# Patient Record
Sex: Female | Born: 1995
Health system: Southern US, Community
[De-identification: ages and names within clinical notes are randomized; demographics above are authoritative.]

## PROBLEM LIST (undated history)

## (undated) DIAGNOSIS — J45909 Unspecified asthma, uncomplicated: Secondary | ICD-10-CM

---

## 2014-01-01 ENCOUNTER — Encounter (HOSPITAL_COMMUNITY): Payer: Self-pay | Admitting: *Deleted

## 2014-01-01 ENCOUNTER — Emergency Department (HOSPITAL_COMMUNITY)
Admission: EM | Admit: 2014-01-01 | Discharge: 2014-01-01 | Disposition: A | Discharge status: Home / Self Care | Payer: BC Managed Care – PPO | Attending: Emergency Medicine | Admitting: Emergency Medicine

## 2014-01-01 DIAGNOSIS — J45909 Unspecified asthma, uncomplicated: Secondary | ICD-10-CM | POA: Diagnosis present

## 2014-01-01 DIAGNOSIS — R000 Tachycardia, unspecified: Secondary | ICD-10-CM | POA: Diagnosis not present

## 2014-01-01 DIAGNOSIS — J45901 Unspecified asthma with (acute) exacerbation: Secondary | ICD-10-CM

## 2014-01-01 HISTORY — DX: Unspecified asthma, uncomplicated: J45.909

## 2014-01-01 MED ORDER — PREDNISONE 20 MG PO TABS: ORAL_TABLET | ORAL | Status: DC

## 2014-01-01 MED ORDER — IPRATROPIUM-ALBUTEROL 0.5-2.5 (3) MG/3ML IN SOLN
3.0000 mL | Freq: Once | RESPIRATORY_TRACT | Status: AC
  Administered 2014-01-01: 3 mL via RESPIRATORY_TRACT
  Filled 2014-01-01: qty 3

## 2014-01-01 MED ORDER — PREDNISONE 20 MG PO TABS
60.0000 mg | ORAL_TABLET | Freq: Once | ORAL | Status: AC
  Administered 2014-01-01: 60 mg via ORAL
  Filled 2014-01-01: qty 3

## 2014-01-01 NOTE — ED Notes (Signed)
Md at bedside

## 2014-01-01 NOTE — Discharge Instructions (Signed)

## 2014-01-01 NOTE — ED Provider Notes (Signed)
CSN: [id number]     Arrival date & time 01/01/14  1526 History   First MD Initiated Contact with Patient 01/01/14 1554     Chief Complaint  Patient presents with  . Asthma     (Consider location/radiation/quality/duration/timing/severity/associated sxs/prior Treatment) HPI 18 year old female with history of asthma presents complaining of an asthma attack. She was in class nearby when she gradually began feeling more shortness of breath. She says it feels like a typical asthma attack, however worse than usual. She took several puffs of her albuterol inhaler with only minimal relief. She presented to the emergency department to [facility] and feeling short of breath. She received albuterol inhaler in triage.  At time of exam patient denies any residual shortness of breath, says she feels much better after breathing treatment. She denies any chest pain, no lightheadedness or syncope.   Past Medical History  Diagnosis Date  . Asthma    History reviewed. No pertinent past surgical history. History reviewed. No pertinent family history. History  Substance Use Topics  . Smoking status: Never Smoker   . Smokeless tobacco: Not on file  . Alcohol Use: No   OB History    No data available     Review of Systems  Respiratory: Positive for shortness of breath and wheezing. Negative for cough.   All other systems reviewed and are negative.     Allergies  Sulfa antibiotics  Home Medications   Prior to Admission medications   Not on File   BP 123/77 mmHg  Pulse 109  Temp(Src) 98.1 F (36.7 C) (Oral)  Resp 20  SpO2 100%  LMP 12/30/2013 Physical Exam  Constitutional: She is oriented to person, place, and time. She appears well-developed and well-nourished. No distress.  HENT:  Head: Normocephalic and atraumatic.  Eyes: Pupils are equal, round, and reactive to light.  Neck: Normal range of motion.  Cardiovascular: Tachycardia present.   Pulmonary/Chest: Effort normal and breath  sounds normal. No respiratory distress. She has no wheezes.  Musculoskeletal: She exhibits no edema or tenderness.  Neurological: She is alert and oriented to person, place, and time.  Skin: No rash noted.  Psychiatric: She has a normal mood and affect.  Nursing note and vitals reviewed.   ED Course  Procedures (including critical care time) Labs Review Labs Reviewed - No data to display  Imaging Review No results found.   EKG Interpretation None      MDM   Final diagnoses:  None    18 year old female presents acute asthma exacerbation. Says it feels similar to previous asthma exacerbations. Arrived wheezing and short of breath in triage and received a DuoNeb there. Upon arrival to room patient was tachycardic appeared anxious but had no wheezing and was in no respiratory distress. Prednisone given. Observed in the emergency department for an hour on pulse oximetry and had no return of symptoms or desaturations. She was walked up and down the halls with no return of symptoms or desaturation. Believe she is stable for discharge home, will give her a 5 day prednisone burst. She'll follow-up with her PCP.    Erskine Emeryhris Wiley Flicker, MD 01/01/14 1735  Nelia Shiobert L Beaton, MD 01/01/14 [phone]

## 2014-01-01 NOTE — ED Notes (Signed)
Pt and family reports hx of asthma, onset of sob pta. No relief with inhaler. spo2 100% at triage, pt appears anxious.

## 2016-03-07 ENCOUNTER — Observation Stay (HOSPITAL_BASED_OUTPATIENT_CLINIC_OR_DEPARTMENT_OTHER)
Admission: EM | Admit: 2016-03-07 | Discharge: 2016-03-08 | Disposition: A | Discharge status: Home / Self Care | Payer: 59 | Attending: Internal Medicine | Admitting: Internal Medicine

## 2016-03-07 ENCOUNTER — Encounter (HOSPITAL_BASED_OUTPATIENT_CLINIC_OR_DEPARTMENT_OTHER): Payer: Self-pay

## 2016-03-07 DIAGNOSIS — R531 Weakness: Secondary | ICD-10-CM | POA: Diagnosis not present

## 2016-03-07 DIAGNOSIS — R509 Fever, unspecified: Secondary | ICD-10-CM

## 2016-03-07 DIAGNOSIS — R1011 Right upper quadrant pain: Secondary | ICD-10-CM

## 2016-03-07 DIAGNOSIS — J45909 Unspecified asthma, uncomplicated: Secondary | ICD-10-CM | POA: Diagnosis not present

## 2016-03-07 DIAGNOSIS — B349 Viral infection, unspecified: Secondary | ICD-10-CM | POA: Diagnosis not present

## 2016-03-07 DIAGNOSIS — I959 Hypotension, unspecified: Secondary | ICD-10-CM | POA: Insufficient documentation

## 2016-03-07 DIAGNOSIS — K2970 Gastritis, unspecified, without bleeding: Secondary | ICD-10-CM | POA: Diagnosis not present

## 2016-03-07 DIAGNOSIS — R0600 Dyspnea, unspecified: Secondary | ICD-10-CM

## 2016-03-07 DIAGNOSIS — R112 Nausea with vomiting, unspecified: Secondary | ICD-10-CM

## 2016-03-07 DIAGNOSIS — J45901 Unspecified asthma with (acute) exacerbation: Secondary | ICD-10-CM | POA: Diagnosis present

## 2016-03-07 DIAGNOSIS — A419 Sepsis, unspecified organism: Secondary | ICD-10-CM | POA: Diagnosis not present

## 2016-03-07 DIAGNOSIS — E860 Dehydration: Secondary | ICD-10-CM

## 2016-03-07 DIAGNOSIS — R0602 Shortness of breath: Secondary | ICD-10-CM | POA: Diagnosis present

## 2016-03-07 DIAGNOSIS — R404 Transient alteration of awareness: Secondary | ICD-10-CM | POA: Diagnosis not present

## 2016-03-07 LAB — CBC
HCT: 39.4 % (ref 36.0–46.0)
Hemoglobin: 13.7 g/dL (ref 12.0–15.0)
MCH: 31.6 pg (ref 26.0–34.0)
MCHC: 34.8 g/dL (ref 30.0–36.0)
MCV: 91 fL (ref 78.0–100.0)
Platelets: 293 10*3/uL (ref 150–400)
RBC: 4.33 MIL/uL (ref 3.87–5.11)
RDW: 12 % (ref 11.5–15.5)
WBC: 11.9 10*3/uL — ABNORMAL HIGH (ref 4.0–10.5)

## 2016-03-07 LAB — COMPREHENSIVE METABOLIC PANEL
ALK PHOS: 39 U/L (ref 38–126)
ALT: 20 U/L (ref 14–54)
ANION GAP: 10 (ref 5–15)
AST: 26 U/L (ref 15–41)
Albumin: 4 g/dL (ref 3.5–5.0)
BILIRUBIN TOTAL: 1.2 mg/dL (ref 0.3–1.2)
BUN: 17 mg/dL (ref 6–20)
CO2: 19 mmol/L — AB (ref 22–32)
CREATININE: 0.75 mg/dL (ref 0.44–1.00)
Calcium: 8.3 mg/dL — ABNORMAL LOW (ref 8.9–10.3)
Chloride: 106 mmol/L (ref 101–111)
Glucose, Bld: 91 mg/dL (ref 65–99)
Potassium: 3.7 mmol/L (ref 3.5–5.1)
SODIUM: 135 mmol/L (ref 135–145)
TOTAL PROTEIN: 6.6 g/dL (ref 6.5–8.1)

## 2016-03-07 LAB — LIPASE, BLOOD: Lipase: 25 U/L (ref 11–51)

## 2016-03-07 MED ORDER — ACETAMINOPHEN 325 MG PO TABS
650.0000 mg | ORAL_TABLET | Freq: Once | ORAL | Status: AC
  Administered 2016-03-07: 650 mg via ORAL
  Filled 2016-03-07: qty 2

## 2016-03-07 MED ORDER — SODIUM CHLORIDE 0.9 % IV BOLUS (SEPSIS)
1000.0000 mL | Freq: Once | INTRAVENOUS | Status: AC
Start: 2016-03-07 — End: 2016-03-08
  Administered 2016-03-08: 1000 mL via INTRAVENOUS

## 2016-03-07 MED ORDER — KETOROLAC TROMETHAMINE 30 MG/ML IJ SOLN
30.0000 mg | Freq: Once | INTRAMUSCULAR | Status: AC
  Administered 2016-03-08: 30 mg via INTRAVENOUS
  Filled 2016-03-07: qty 1

## 2016-03-07 MED ORDER — DICYCLOMINE HCL 10 MG/ML IM SOLN
20.0000 mg | Freq: Once | INTRAMUSCULAR | Status: AC
  Administered 2016-03-08: 20 mg via INTRAMUSCULAR
  Filled 2016-03-07: qty 2

## 2016-03-07 NOTE — ED Provider Notes (Signed)
By signing my name below, I, Vista Mink, attest that this documentation has been prepared under the direction and in the presence of Kristen N Ward, DO. Electronically signed, Vista Mink, ED Scribe. 03/07/16. 11:35 PM.  TIME SEEN: 11:39 PM  CHIEF COMPLAINT: Emesis, shortness of breath.  HPI:  HPI Comments: Bridget Melendez is a 20 y.o. female with history of asthma brought in by ambulance, who presents to the Emergency Department complaining of persistent nausea with associated vomiting that started at approximately 1700 this evening. She has had difficulty tolerating PO's. She also notes that she had an asthma exacerbation this evening following the nausea and vomiting. She has had approximately 7 episodes of vomiting. She reports a fever at home but did not take any OTC medications at home. Her temperature on arrival here is 101.4. She used a rescue inhaler at home and received a nebulizer treatment on arrival of EMS. Was not given Solu-Medrol. Pt used her rescue inhaler at home and mother states that she did not want to continue to use it because she could feel her heart racing. Her nausea is currently improved after being given Zofran 4mg  in route by EMS. 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 No recent travel. Her flu shot is not UTD. No cough, nasal congestion. Has had some body aches. No diarrhea. No noted rash. Complains of feeling crampy abdominal pain diffusely. No history of abdominal surgery. No dysuria, hematuria, vaginal bleeding or discharge.  ROS: See HPI Constitutional: + fever  Eyes: no drainage  ENT: no runny nose   Cardiovascular:  no chest pain  Resp: + SOB  GI: + vomiting GU: no dysuria Integumentary: no rash  Allergy: no hives  Musculoskeletal: no leg swelling  Neurological: no slurred speech ROS otherwise negative  PAST MEDICAL HISTORY/PAST SURGICAL HISTORY:  Past Medical History:  Diagnosis  Date  . Asthma    MEDICATIONS:  Prior to Admission medications   Medication Sig Start Date End Date Taking? Authorizing Provider  ADVAIR HFA 230-21 MCG/ACT inhaler Inhale 2 puffs into the lungs daily. 12/27/13   Historical Provider, MD  montelukast (SINGULAIR) 10 MG tablet Take 10 mg by mouth daily. 12/04/13   Historical Provider, MD   ALLERGIES:  Allergies  Allergen Reactions  . Sulfa Antibiotics     SOCIAL HISTORY:  Social History  Substance Use Topics  . Smoking status: Never Smoker  . Smokeless tobacco: Never Used  . Alcohol use No   FAMILY HISTORY: No family history on file.  EXAM: BP 99/55   Pulse (!) 127   Temp 101.4 F (38.6 C)   Resp 20   Ht 5\' 2"  (1.575 m)   Wt 125 lb (56.7 kg)   LMP 02/29/2016   SpO2 98%   BMI 22.86 kg/m  CONSTITUTIONAL: Alert and oriented and responds appropriately to questions. Well-appearing; well-nourished HEAD: Normocephalic EYES: Conjunctivae clear, PERRL, EOMI ENT: normal nose; no rhinorrhea; dry mucous membranes NECK: Supple, no meningismus, no nuchal rigidity, no LAD  CARD: Regular and tachycardic; S1 and S2 appreciated; no murmurs, no clicks, no rubs, no gallops RESP: Normal chest excursion without splinting or tachypnea; breath sounds clear and equal bilaterally; no wheezes, no rhonchi, no rales, no hypoxia or respiratory distress, speaking full sentences ABD/GI: Normal bowel sounds; non-distended; soft, mildly tender to palpation diffusely, no rebound, no guarding, no peritoneal signs, no hepatosplenomegaly BACK:  The back appears normal and is non-tender to  palpation, there is no CVA tenderness EXT: Normal ROM in all joints; non-tender to palpation; no edema; normal capillary refill; no cyanosis, no calf tenderness or swelling    SKIN: Normal color for age and race; warm; no rash NEURO: Moves all extremities equally, sensation to light touch intact diffusely, cranial nerves II through XII intact, normal speech PSYCH: The  patient's mood and manner are appropriate. Grooming and personal hygiene are appropriate.  MEDICAL DECISION MAKING: Patient here with likely viral illness. Patient began having nausea and vomiting tonight with diffuse abdominal cramping. No diarrhea. States had a subsequent asthma exacerbation that has resolved after albuterol with EMS. Not given steroids. No recent cough. No shortness of breath currently and lungs are clear. No hypoxia. She is tachycardic which may be from discomfort, dehydration, albuterol, fever. Will give IV fluids, Toradol, Bentyl and reassess. Given Zofran with EMS and states nausea has improved. Abdominal exam is benign. She is mildly tender diffusely without focality.  ED PROGRESS: Patient's labs show bicarbonate of 19, large ketones in her urine. Mild leukocytosis. Suggestive of dehydration. She has received 2 L of IV fluids and is still tachycardic and mildly hypotensive. Fever has improved. On reevaluation of her abdomen she is now tender locally in the right upper quadrant with a positive Murphy sign. No tenderness at McBurney's point. No known history of gallbladder disease. States she has noticed right upper quadrant pain for the past several days with no aggravating or alleviating factors. LFTs and lipase are normal today but I feel she will need an ultrasound to evaluate her gallbladder. Unfortunately we cannot perform this test in our emergency department this time therefore [facility]. Discussed with Dr. Mora Bellmanni in the emergency department who agrees to accept the patient in transfer.  Patient and family have been updated with this plan. Patient refuses further pain medication at this time. We will keep her NPO.  I reviewed all nursing notes, vitals, pertinent old records, EKGs, labs, imaging (as available).  I personally performed the services described in this documentation, which was scribed in my presence. The recorded  information has been reviewed and is accurate.     Layla MawKristen N Ward, DO 03/08/16 [phone]

## 2016-03-07 NOTE — ED Triage Notes (Signed)
Per EMS pt c/o flu like symptoms, SBP 92, gave NS 1L and zofran 4mg  with SBP 108; pt in no distress

## 2016-03-08 ENCOUNTER — Encounter (HOSPITAL_COMMUNITY): Payer: Self-pay | Admitting: Emergency Medicine

## 2016-03-08 ENCOUNTER — Inpatient Hospital Stay (HOSPITAL_COMMUNITY): Payer: 59

## 2016-03-08 ENCOUNTER — Observation Stay (HOSPITAL_COMMUNITY): Payer: 59

## 2016-03-08 DIAGNOSIS — B349 Viral infection, unspecified: Secondary | ICD-10-CM

## 2016-03-08 DIAGNOSIS — K2900 Acute gastritis without bleeding: Secondary | ICD-10-CM | POA: Diagnosis not present

## 2016-03-08 DIAGNOSIS — J4521 Mild intermittent asthma with (acute) exacerbation: Secondary | ICD-10-CM | POA: Diagnosis not present

## 2016-03-08 DIAGNOSIS — A419 Sepsis, unspecified organism: Secondary | ICD-10-CM | POA: Diagnosis present

## 2016-03-08 DIAGNOSIS — R109 Unspecified abdominal pain: Secondary | ICD-10-CM | POA: Diagnosis not present

## 2016-03-08 DIAGNOSIS — R1011 Right upper quadrant pain: Secondary | ICD-10-CM | POA: Diagnosis not present

## 2016-03-08 DIAGNOSIS — R0602 Shortness of breath: Secondary | ICD-10-CM | POA: Diagnosis not present

## 2016-03-08 DIAGNOSIS — R1110 Vomiting, unspecified: Secondary | ICD-10-CM | POA: Diagnosis not present

## 2016-03-08 DIAGNOSIS — J45901 Unspecified asthma with (acute) exacerbation: Secondary | ICD-10-CM | POA: Diagnosis present

## 2016-03-08 LAB — RESPIRATORY PANEL BY PCR
ADENOVIRUS-RVPPCR: NOT DETECTED
Bordetella pertussis: NOT DETECTED
CHLAMYDOPHILA PNEUMONIAE-RVPPCR: NOT DETECTED
CORONAVIRUS NL63-RVPPCR: NOT DETECTED
CORONAVIRUS OC43-RVPPCR: NOT DETECTED
Coronavirus 229E: NOT DETECTED
Coronavirus HKU1: NOT DETECTED
INFLUENZA A-RVPPCR: NOT DETECTED
Influenza B: NOT DETECTED
Metapneumovirus: NOT DETECTED
Mycoplasma pneumoniae: NOT DETECTED
PARAINFLUENZA VIRUS 1-RVPPCR: NOT DETECTED
PARAINFLUENZA VIRUS 4-RVPPCR: NOT DETECTED
Parainfluenza Virus 2: NOT DETECTED
Parainfluenza Virus 3: NOT DETECTED
Respiratory Syncytial Virus: NOT DETECTED
Rhinovirus / Enterovirus: NOT DETECTED

## 2016-03-08 LAB — LACTIC ACID, PLASMA
Lactic Acid, Venous: 0.9 mmol/L (ref 0.5–1.9)
Lactic Acid, Venous: 0.8 mmol/L (ref 0.5–1.9)

## 2016-03-08 LAB — URINALYSIS, ROUTINE W REFLEX MICROSCOPIC
Bilirubin Urine: NEGATIVE
GLUCOSE, UA: NEGATIVE mg/dL
HGB URINE DIPSTICK: NEGATIVE
Ketones, ur: >80 mg/dL — AB
Leukocytes, UA: NEGATIVE
Nitrite: NEGATIVE
PROTEIN: NEGATIVE mg/dL
Specific Gravity, Urine: 1.023 (ref 1.005–1.030)
pH: 6 (ref 5.0–8.0)

## 2016-03-08 LAB — PREGNANCY, URINE: PREG TEST UR: NEGATIVE

## 2016-03-08 LAB — I-STAT CG4 LACTIC ACID, ED: LACTIC ACID, VENOUS: 0.87 mmol/L (ref 0.5–1.9)

## 2016-03-08 LAB — INFLUENZA PANEL BY PCR (TYPE A & B)
INFLAPCR: NEGATIVE
Influenza B By PCR: NEGATIVE

## 2016-03-08 MED ORDER — FENTANYL CITRATE (PF) 100 MCG/2ML IJ SOLN
50.0000 ug | Freq: Once | INTRAMUSCULAR | Status: AC
  Administered 2016-03-08: 50 ug via INTRAVENOUS
  Filled 2016-03-08: qty 2

## 2016-03-08 MED ORDER — SODIUM CHLORIDE 0.9 % IV SOLN
INTRAVENOUS | Status: DC
  Administered 2016-03-08: 01:00:00 via INTRAVENOUS

## 2016-03-08 MED ORDER — ACETAMINOPHEN 325 MG PO TABS
650.0000 mg | ORAL_TABLET | Freq: Once | ORAL | Status: AC
  Administered 2016-03-08: 650 mg via ORAL
  Filled 2016-03-08: qty 2

## 2016-03-08 MED ORDER — PANTOPRAZOLE SODIUM 40 MG PO TBEC
40.0000 mg | DELAYED_RELEASE_TABLET | Freq: Every day | ORAL | 0 refills | Status: DC
Start: 2016-03-08 — End: 2016-08-03

## 2016-03-08 MED ORDER — ENOXAPARIN SODIUM 40 MG/0.4ML ~~LOC~~ SOLN: 40.0000 mg | SUBCUTANEOUS | Status: DC

## 2016-03-08 MED ORDER — PIPERACILLIN-TAZOBACTAM 3.375 G IVPB 30 MIN
3.3750 g | Freq: Once | INTRAVENOUS | Status: AC
  Administered 2016-03-08: 3.375 g via INTRAVENOUS
  Filled 2016-03-08: qty 50

## 2016-03-08 MED ORDER — PANTOPRAZOLE SODIUM 40 MG PO TBEC
40.0000 mg | DELAYED_RELEASE_TABLET | Freq: Two times a day (BID) | ORAL | Status: DC
  Administered 2016-03-08: 40 mg via ORAL
  Filled 2016-03-08: qty 1

## 2016-03-08 MED ORDER — IOPAMIDOL (ISOVUE-300) INJECTION 61%
INTRAVENOUS | Status: AC
  Filled 2016-03-08: qty 100

## 2016-03-08 MED ORDER — ACETAMINOPHEN 650 MG RE SUPP: 650.0000 mg | Freq: Four times a day (QID) | RECTAL | Status: DC | PRN

## 2016-03-08 MED ORDER — SUCRALFATE 1 GM/10ML PO SUSP
1.0000 g | Freq: Three times a day (TID) | ORAL | Status: DC
  Administered 2016-03-08: 1 g via ORAL
  Filled 2016-03-08: qty 10

## 2016-03-08 MED ORDER — ONDANSETRON HCL 4 MG/2ML IJ SOLN
4.0000 mg | Freq: Once | INTRAMUSCULAR | Status: AC
  Administered 2016-03-08: 4 mg via INTRAVENOUS
  Filled 2016-03-08: qty 2

## 2016-03-08 MED ORDER — LORAZEPAM 2 MG/ML IJ SOLN: 1.0000 mg | Freq: Once | INTRAMUSCULAR | Status: DC

## 2016-03-08 MED ORDER — ACETAMINOPHEN 500 MG PO TABS: 500.0000 mg | ORAL_TABLET | Freq: Four times a day (QID) | ORAL | 0 refills | Status: DC | PRN

## 2016-03-08 MED ORDER — ONDANSETRON HCL 4 MG/2ML IJ SOLN: 4.0000 mg | Freq: Four times a day (QID) | INTRAMUSCULAR | Status: DC | PRN

## 2016-03-08 MED ORDER — ONDANSETRON HCL 4 MG PO TABS
4.0000 mg | ORAL_TABLET | Freq: Four times a day (QID) | ORAL | 0 refills | Status: DC | PRN
Start: 2016-03-08 — End: 2016-08-03

## 2016-03-08 MED ORDER — ONDANSETRON HCL 4 MG PO TABS
4.0000 mg | ORAL_TABLET | Freq: Four times a day (QID) | ORAL | Status: DC | PRN
Start: 2016-03-08 — End: 2016-03-08

## 2016-03-08 MED ORDER — IOPAMIDOL (ISOVUE-300) INJECTION 61%
100.0000 mL | Freq: Once | INTRAVENOUS | Status: AC | PRN
Start: 2016-03-08 — End: 2016-03-08
  Administered 2016-03-08: 100 mL via INTRAVENOUS

## 2016-03-08 MED ORDER — IPRATROPIUM-ALBUTEROL 0.5-2.5 (3) MG/3ML IN SOLN: 3.0000 mL | RESPIRATORY_TRACT | Status: DC | PRN

## 2016-03-08 MED ORDER — DEXTROSE-NACL 5-0.9 % IV SOLN
INTRAVENOUS | Status: DC
  Administered 2016-03-08: 12:00:00 via INTRAVENOUS

## 2016-03-08 MED ORDER — SODIUM CHLORIDE 0.9 % NICU IV INFUSION SIMPLE
INJECTION | INTRAVENOUS | Status: DC
  Filled 2016-03-08 (×3): qty 500

## 2016-03-08 MED ORDER — SUCRALFATE 1 GM/10ML PO SUSP: 1.0000 g | Freq: Three times a day (TID) | ORAL | 0 refills | Status: AC

## 2016-03-08 MED ORDER — DIPHENHYDRAMINE HCL 50 MG/ML IJ SOLN: 50.0000 mg | Freq: Once | INTRAMUSCULAR | Status: DC

## 2016-03-08 MED ORDER — SODIUM CHLORIDE 0.9 % IV BOLUS (SEPSIS)
1000.0000 mL | Freq: Once | INTRAVENOUS | Status: AC
Start: 2016-03-08 — End: 2016-03-08
  Administered 2016-03-08: 1000 mL via INTRAVENOUS

## 2016-03-08 MED ORDER — ACETAMINOPHEN 325 MG PO TABS: 650.0000 mg | ORAL_TABLET | Freq: Four times a day (QID) | ORAL | Status: DC | PRN

## 2016-03-08 NOTE — ED Provider Notes (Signed)
Patient transferred for RUQ US which is negative for gb pathology.  She continues to only have pain in the RUQ.  On my evaluation, she is still hypotensive and tachycardic after3L IVF.  I have ordered a 4th.  Also CT of the abd/pelvis as this could be an appy with referred pain to the RUQ.  She was given tylenol as her fever as come back.  Patient will certainly be admitted for further care.   Tomasita CrumbleAdeleke Kairyn Olmeda, MD 03/08/16 [phone]

## 2016-03-08 NOTE — ED Notes (Signed)
Admitting physician at bedside

## 2016-03-08 NOTE — ED Notes (Signed)
Unable to collect lab patient wants blood pulled from her IV

## 2016-03-08 NOTE — H&P (Signed)
History and Physical    Bridget Melendez WUJ:[id number] DOB: 30-Apr-1995 DOA: 03/07/2016  PCP: No PCP Per Patient   Patient coming from: Home   Chief Complaint: Nausea, vomiting, abdominal pain and dyspnea.   HPI: Bridget Melendez is a 21 y.o. female with medical history significant of well controlled asthma, who presents with the chief complain of nausea and vomiting. Her symptoms of nausea and vomiting appear abruptly around 1700 yesterday, severe and persistent, multiple episodes of vomiting, with no improving or worsening factors, associated with right upper quadrant abdominal pain fever and posteriorly resulting in dyspnea with wheezing. Due to worsening symptoms patient was brought to the ED for further evaluation.   Patient mentions possibility of sick contacts.   ED Course: Extensive work up negative, patient febrile and hypotensive, referred for admission and further evaluation. Received Zosyn and IV fluids.   Review of Systems:  1. General. Positive for fever, generalized malaise  2. ENT. No runny nose or sore throat 3. Pulmonary. Positive for shortness of breath and wheezing, cough but no hemoptysis 4. Cardiovascular. Positive for palpitations, no chest pain, no PND orthopnea 5. Gastrointestinal. Positive for abdominal pain, right upper quadrant pain, positive nausea and vomiting as mentioned in history present illness. 6. Dermatology no rashes 7. Endocrine. No tremors, heat or cold tolerance 8. Hematology no easy bruisability or frequent infections 9. Urology no dysuria or increased urinary frequency 10. Neurology no seizures or paresthesias  Past Medical History:  Diagnosis Date  . Asthma     History reviewed. No pertinent surgical history.   reports that she has never smoked. She has never used smokeless tobacco. She reports that she does not drink alcohol or use drugs.  Allergies  Allergen Reactions  . Sulfa Antibiotics Hives    No family history on  file. Unacceptable: Noncontributory, unremarkable, or negative. Acceptable: Family history reviewed and not pertinent (If you reviewed it)  Prior to Admission medications   Medication Sig Start Date End Date Taking? Authorizing Provider  ARNUITY ELLIPTA 100 MCG/ACT AEPB Inhale 1 puff into the lungs daily. 02/09/16  Yes Historical Provider, MD    Physical Exam: Vitals:   03/08/16 0430 03/08/16 0530 03/08/16 0700 03/08/16 0730  BP: 102/55 97/59 90/55  92/55  Pulse: (!) 121 120 113 115  Resp:  16 21 20   Temp:      TempSrc:      SpO2: 98% 97% 94% 96%  Weight:      Height:          Constitutional: deconditioned  Vitals:   03/08/16 0430 03/08/16 0530 03/08/16 0700 03/08/16 0730  BP: 102/55 97/59 90/55  92/55  Pulse: (!) 121 120 113 115  Resp:  16 21 20   Temp:      TempSrc:      SpO2: 98% 97% 94% 96%  Weight:      Height:       Eyes: PERRL, lids and conjunctivae normal, no pallor or icterus.  Head normocephalic, nose and ears no deformities. ENMT: Mucous membranes are dry. Posterior pharynx clear of any exudate or lesions.Normal dentition.  Neck: normal, supple, no masses, no thyromegaly Respiratory: clear to auscultation bilaterally, no wheezing. Mild crackles at bases. Normal respiratory effort. No accessory muscle use.  Cardiovascular: Regular rate and rhythm, no murmurs / rubs / gallops. No extremity edema. 2+ pedal pulses. No carotid bruits.  Abdomen: mild tender to deep palpation, no masses palpated. No hepatosplenomegaly. Bowel sounds positive.  Musculoskeletal: no clubbing / cyanosis. No joint deformity upper  and lower extremities. Good ROM, no contractures. Normal muscle tone.  Skin: no rashes, lesions, ulcers. No induration Neurologic: CN 2-12 grossly intact. Sensation intact, DTR normal. Strength 5/5 in all 4.    Labs on Admission: I have personally reviewed following labs and imaging studies  CBC:  Recent Labs Lab 03/07/16 2305  WBC 11.9*  HGB 13.7  HCT  39.4  MCV 91.0  PLT 293   Basic Metabolic Panel:  Recent Labs Lab 03/07/16 2305  NA 135  K 3.7  CL 106  CO2 19*  GLUCOSE 91  BUN 17  CREATININE 0.75  CALCIUM 8.3*   GFR: Estimated Creatinine Clearance: 88 mL/min (by C-G formula based on SCr of 0.75 mg/dL). Liver Function Tests:  Recent Labs Lab 03/07/16 2305  AST 26  ALT 20  ALKPHOS 39  BILITOT 1.2  PROT 6.6  ALBUMIN 4.0    Recent Labs Lab 03/07/16 2305  LIPASE 25   No results for input(s): AMMONIA in the last 168 hours. Coagulation Profile: No results for input(s): INR, PROTIME in the last 168 hours. Cardiac Enzymes: No results for input(s): CKTOTAL, CKMB, CKMBINDEX, TROPONINI in the last 168 hours. BNP (last 3 results) No results for input(s): PROBNP in the last 8760 hours. HbA1C: No results for input(s): HGBA1C in the last 72 hours. CBG: No results for input(s): GLUCAP in the last 168 hours. Lipid Profile: No results for input(s): CHOL, HDL, LDLCALC, TRIG, CHOLHDL, LDLDIRECT in the last 72 hours. Thyroid Function Tests: No results for input(s): TSH, T4TOTAL, FREET4, T3FREE, THYROIDAB in the last 72 hours. Anemia Panel: No results for input(s): VITAMINB12, FOLATE, FERRITIN, TIBC, IRON, RETICCTPCT in the last 72 hours. Urine analysis:    Component Value Date/Time   COLORURINE YELLOW 03/08/2016 0010   APPEARANCEUR CLOUDY (A) 03/08/2016 0010   LABSPEC 1.023 03/08/2016 0010   PHURINE 6.0 03/08/2016 0010   GLUCOSEU NEGATIVE 03/08/2016 0010   HGBUR NEGATIVE 03/08/2016 0010   BILIRUBINUR NEGATIVE 03/08/2016 0010   KETONESUR >80 (A) 03/08/2016 0010   PROTEINUR NEGATIVE 03/08/2016 0010   NITRITE NEGATIVE 03/08/2016 0010   LEUKOCYTESUR NEGATIVE 03/08/2016 0010   Sepsis Labs: !!!!!!!!!!!!!!!!!!!!!!!!!!!!!!!!!!!!!!!!!!!! @LABRCNTIP (procalcitonin:4,lacticidven:4) )No results found for this or any previous visit (from the past 240 hour(s)).   Radiological Exams on Admission: Ct Abdomen Pelvis W  Contrast  Result Date: 03/08/2016 CLINICAL DATA:  21 year old female with abdominal pain and vomiting. EXAM: CT ABDOMEN AND PELVIS WITH CONTRAST TECHNIQUE: Multidetector CT imaging of the abdomen and pelvis was performed using the standard protocol following bolus administration of intravenous contrast. CONTRAST:  100mL ISOVUE-300 IOPAMIDOL (ISOVUE-300) INJECTION 61% COMPARISON:  Abdominal ultrasound dated 03/08/2016 FINDINGS: Lower chest: The visualized lung bases are clear. No intra-abdominal free air or free fluid. Hepatobiliary: Subcentimeter left hepatic hypodense lesion is not well characterized but likely represents a cyst or hemangioma. The liver is otherwise unremarkable. No intrahepatic biliary ductal dilatation. The gallbladder is unremarkable as well. Pancreas: Unremarkable. No pancreatic ductal dilatation or surrounding inflammatory changes. Spleen: Normal in size without focal abnormality. Adrenals/Urinary Tract: There is faint calcification of the right adrenal gland likely sequela of prior infection or hemorrhage. The left adrenal gland is unremarkable. The kidneys, visualized ureters appear unremarkable. The urinary bladder is collapsed. Stomach/Bowel: The stomach is mildly distended with fluid content. There is thickened appearance of the distal stomach and gastric antrum most likely representing gastritis. Clinical correlation is recommended. There is no evidence of bowel obstruction. Nondistended fluid-filled loops of small bowel may be physiologic or represent mild  enteritis. There is loose stool within the colon. Normal appendix. Vascular/Lymphatic: No significant vascular findings are present. No enlarged abdominal or pelvic lymph nodes. Reproductive: The uterus is retroverted and grossly unremarkable. The right ovary is unremarkable. There is a 2.2 cm left ovarian cyst. Other: None Musculoskeletal: The osseous structures are unremarkable. Small pockets of gas in the right gluteal  subcutaneous fat likely related to recent injection or trauma. Clinical correlation is recommended. No fluid collection or abscess. IMPRESSION: Findings most compatible with gastritis or gastroenteritis. Clinical correlation is recommended. No bowel obstruction. Normal appendix. Electronically Signed   By: Elgie Collard M.D.   On: 03/08/2016 05:31   US Abdomen Limited Ruq  Result Date: 03/08/2016 CLINICAL DATA:  21 year old female with right upper quadrant abdominal pain and fever. EXAM: US ABDOMEN LIMITED - RIGHT UPPER QUADRANT COMPARISON:  None. FINDINGS: Gallbladder: No gallstones or wall thickening visualized. No sonographic Murphy sign noted by sonographer. Common bile duct: Diameter: 2 mm Liver: No focal lesion identified. Within normal limits in parenchymal echogenicity. IMPRESSION: Unremarkable right upper quadrant ultrasound. Electronically Signed   By: Elgie Collard M.D.   On: 03/08/2016 04:03    EKG: Independently reviewed. NA  Assessment/Plan Active Problems:   Sepsis (HCC)   Asthma flare   This is a 21 year old female with significant medical history for well-controlled asthma who presents with sudden onset of abdominal pain, nausea and vomiting. Her symptoms were persistent and worsening resulting in dyspnea and wheezing. On initial physical examination temperature 101.4, blood pressure 99/50, heart rate 127, oxygen saturation 98% on room air. He was blood pressure 89/48. Her mucosa is moist after IV fluids, her lungs are clear to auscultation bilaterally, heart S1-S2 present tachycardic, her abdomen is soft, mildly tender to deep palpation. Sodium 135, potassium 3.7, chloride 106, bicarbonate 19, glucose 91, BUN 17, creatinine 0.75, lipase 25, AST 26, ALT 20, white count 11.9, hemoglobin 13.7, hematocrit 39.4, platelets 293. Pregnancy test negative. Lactic acid 0.9, 0.8. Urinalysis negative for infection. CT of the abdomen show findings consistent with gastritis/gastroenteritis,  the right upper quadrant ultrasound negative.   Working diagnosis. Sepsis due to suspected viral origin, likely influenza complicated by asthma exacerbation and gastritis.  1. Sepsis. Due to fever and tachycardia patient qualifies for sepsis following the SIRS criteria, patient received broad-spectrum antibiotics in the emergency department. Clinically likely viral in etiology, will admit patient to the medical floor, will continue supportive IV fluids with isotonic saline, close follow-up on blood pressure, target map of 65. As a part of the workup will do a chest x-ray. Follow-up on influenza PCR and respiratory viral panel.   2. Gastritis. Possibly related to influenza, will continue supportive care, advance diet as tolerated, will place patient on antiacid therapy with Protonix and sucralfate. Analgesia and antiemetics.   3. Asthma. Clinically no wheezing, no desaturation, will follow-up on chest x-ray. At this point will order bronchodilator as needed, continue inhaled corticosteroids and long-acting beta 2 agonist. Oximetry monitoring and supplemental oxygen as needed.    DVT prophylaxis: enoxaparin Code Status: Full  Family Communication: I spoke with patient's family at the bedside and all questions were addressed.  Disposition Plan: Home Consults called: None  Admission status: Observation   Bridget Melendez Annett Gula MD [facility] Pager [phone]  If 7PM-7AM, please contact night-coverage [email or web address] Password [email or web address]  03/08/2016, 8:10 AM

## 2016-03-08 NOTE — ED Notes (Signed)
Pt had episode of bowel incontinence. Dr. Ella JubileeArrien informed and states he would be done here to the ED to speak with pt and family

## 2016-03-08 NOTE — ED Notes (Signed)
Pt able to ambulate in hallway with independent steady gait, NAD, witnessed by Dr. Ella JubileeArrien.

## 2016-03-08 NOTE — ED Triage Notes (Signed)
Pt comes from [facility], right upper abdominal pain, NV denies diarrhea. Fever temp 101, j Iv left ac 20, 3 liters normal saline in. 4 mg zofran. 650 tylenol  30 Toradol and bentyl , gcs 15, bp 105/60, hr 117, sinus tach, and rr18. Allergies sulfa . And hx asthma Family on the way.

## 2016-03-08 NOTE — Discharge Summary (Signed)
Physician Discharge Summary  Bridget Melendez ZOX:[id number] DOB: [birth year] DOA: 03/07/2016  PCP: No PCP Per Patient  Admit date: 03/07/2016 Discharge date: 03/08/2016  Admitted From: Home   Disposition:  Home   Recommendations for Outpatient Follow-up:  1. Follow up with PCP in 1- week 2. Patient placed on antiacid therapy and antiemetics as needed. 3. Patient and her family at the bedside, were advised about returning to the ED in case of recurrent symptoms, including fever, nausea, vomiting or abdominal pain.  4. Pending PCR influenza, patient refused empiric Tamiflu.   Home Health: NO  Equipment/Devices: NO   Discharge Condition: Stable  CODE STATUS: Full Diet recommendation: Regular.   Brief/Interim Summary:   Discharge Diagnoses:  Active Problems:   Sepsis (HCC)   Asthma flare  This is a 21 year old female with significant medical history for well-controlled asthma who presents with sudden onset of abdominal pain, nausea and vomiting. Her symptoms were persistent and worsening resulting in dyspnea and wheezing. On initial physical examination temperature 101.4, blood pressure 99/50, heart rate 127, oxygen saturation 98% on room air. Her blood pressure 89/48. Her mucosa was moist after IV fluids, her lungs were clear to auscultation bilaterally, heart S1-S2 present tachycardic, her abdomen was soft, mildly tender to deep palpation. Sodium 135, potassium 3.7, chloride 106, bicarbonate 19, glucose 91, BUN 17, creatinine 0.75, lipase 25, AST 26, ALT 20, white count 11.9, hemoglobin 13.7, hematocrit 39.4, platelets 293. Pregnancy test negative. Lactic acid 0.9, 0.8. Urinalysis negative for infection. CT of the abdomen show findings consistent with gastritis/gastroenteritis, the right upper quadrant ultrasound negative.   Working diagnosis. Sepsis due to suspected viral origin, likely influenza complicated by asthma exacerbation and gastritis.   1. Viral illness. Influenza PCR is  still pending. Patient refused empiric therapy with Tamiflu. Had influenza in the past and did not responded well to Tamiflu. At this poin no need for further antibiotic therapy. After observation in the ED patient remained stable, symptoms have improved, no fever and MAP above 65. Patient will have a trial of po intake, if no further vomiting and patient can ambulate, will discharge home with close follow up as outpatient.  Chest film personally reviewed noted no infiltrates.   2. Gastritis. Will continue protonix and sucralfate, as outpatient, zofran for nausea control.   3. Asthma. No clinical signs of bronchospasm. Will continue home regimen with arnuity.     Discharge Instructions   Allergies as of 03/08/2016      Reactions   Sulfa Antibiotics Hives      Medication List    TAKE these medications   acetaminophen 500 MG tablet Commonly known as:  TYLENOL Take 1 tablet (500 mg total) by mouth every 6 (six) hours as needed for moderate pain or fever.   ARNUITY ELLIPTA 100 MCG/ACT Aepb Generic drug:  Fluticasone Furoate Inhale 1 puff into the lungs daily.   ondansetron 4 MG tablet Commonly known as:  ZOFRAN Take 1 tablet (4 mg total) by mouth every 6 (six) hours as needed for nausea.   pantoprazole 40 MG tablet Commonly known as:  PROTONIX Take 1 tablet (40 mg total) by mouth daily.   sucralfate 1 GM/10ML suspension Commonly known as:  CARAFATE Take 10 mLs (1 g total) by mouth 4 (four) times daily -  with meals and at bedtime.       Allergies  Allergen Reactions  . Sulfa Antibiotics Hives    Consultations:  None    Procedures/Studies: Dg Chest 2 View  Result Date: 03/08/2016 CLINICAL DATA:  shortness of breath and pain EXAM: CHEST  2 VIEW COMPARISON:  None. FINDINGS: Lungs are clear. Heart size and pulmonary vascularity are normal. No adenopathy. No pneumothorax. No bone lesions. IMPRESSION: No edema or consolidation. Electronically Signed   By: Bretta Bang  III M.D.   On: 03/08/2016 12:14   Ct Abdomen Pelvis W Contrast  Result Date: 03/08/2016 CLINICAL DATA:  21 year old female with abdominal pain and vomiting. EXAM: CT ABDOMEN AND PELVIS WITH CONTRAST TECHNIQUE: Multidetector CT imaging of the abdomen and pelvis was performed using the standard protocol following bolus administration of intravenous contrast. CONTRAST:  ISOVUE-300 IOPAMIDOL (ISOVUE-300) INJECTION 61% COMPARISON:  Abdominal ultrasound dated 03/08/2016 FINDINGS: Lower chest: The visualized lung bases are clear. No intra-abdominal free air or free fluid. Hepatobiliary: Subcentimeter left hepatic hypodense lesion is not well characterized but likely represents a cyst or hemangioma. The liver is otherwise unremarkable. No intrahepatic biliary ductal dilatation. The gallbladder is unremarkable as well. Pancreas: Unremarkable. No pancreatic ductal dilatation or surrounding inflammatory changes. Spleen: Normal in size without focal abnormality. Adrenals/Urinary Tract: There is faint calcification of the right adrenal gland likely sequela of prior infection or hemorrhage. The left adrenal gland is unremarkable. The kidneys, visualized ureters appear unremarkable. The urinary bladder is collapsed. Stomach/Bowel: The stomach is mildly distended with fluid content. There is thickened appearance of the distal stomach and gastric antrum most likely representing gastritis. Clinical correlation is recommended. There is no evidence of bowel obstruction. Nondistended fluid-filled loops of small bowel may be physiologic or represent mild enteritis. There is loose stool within the colon. Normal appendix. Vascular/Lymphatic: No significant vascular findings are present. No enlarged abdominal or pelvic lymph nodes. Reproductive: The uterus is retroverted and grossly unremarkable. The right ovary is unremarkable. There is a 2.2 cm left ovarian cyst. Other: None Musculoskeletal: The osseous structures are  unremarkable. Small pockets of gas in the right gluteal subcutaneous fat likely related to recent injection or trauma. Clinical correlation is recommended. No fluid collection or abscess. IMPRESSION: Findings most compatible with gastritis or gastroenteritis. Clinical correlation is recommended. No bowel obstruction. Normal appendix. Electronically Signed   By: Elgie Collard M.D.   On: 03/08/2016 05:31   US Abdomen Limited Ruq  Result Date: 03/08/2016 CLINICAL DATA:  21 year old female with right upper quadrant abdominal pain and fever. EXAM: US ABDOMEN LIMITED - RIGHT UPPER QUADRANT COMPARISON:  None. FINDINGS: Gallbladder: No gallstones or wall thickening visualized. No sonographic Murphy sign noted by sonographer. Common bile duct: Diameter: 2 mm Liver: No focal lesion identified. Within normal limits in parenchymal echogenicity. IMPRESSION: Unremarkable right upper quadrant ultrasound. Electronically Signed   By: Elgie Collard M.D.   On: 03/08/2016 04:03      Subjective: Patient is feeling much better, no nausea or vomiting, abdominal has improved, no nausea or vomiting.   Discharge Exam: Vitals:   03/08/16 1100 03/08/16 1130  BP: 96/64 92/58  Pulse: 101 100  Resp: 15 16  Temp:     Vitals:   03/08/16 1000 03/08/16 1030 03/08/16 1100 03/08/16 1130  BP: 98/65 94/59 96/64  92/58  Pulse: 91 93 101 100  Resp: 17 16 15 16   Temp:      TempSrc:      SpO2: 98% 98% 98% 98%  Weight:      Height:        General: Pt is alert, awake, not in acute distress Cardiovascular: RRR, S1/S2 +, no rubs, no gallops Respiratory: CTA bilaterally, no  wheezing, no rhonchi Abdominal: Soft, NT, ND, bowel sounds + . Mild tender to deep palpation.  Extremities: no edema, no cyanosis    The results of significant diagnostics from this hospitalization (including imaging, microbiology, ancillary and laboratory) are listed below for reference.     Microbiology: No results found for this or any  previous visit (from the past 240 hour(s)).   Labs: BNP (last 3 results) No results for input(s): BNP in the last 8760 hours. Basic Metabolic Panel:  Recent Labs Lab 03/07/16 2305  NA 135  K 3.7  CL 106  CO2 19*  GLUCOSE 91  BUN 17  CREATININE 0.75  CALCIUM 8.3*   Liver Function Tests:  Recent Labs Lab 03/07/16 2305  AST 26  ALT 20  ALKPHOS 39  BILITOT 1.2  PROT 6.6  ALBUMIN 4.0    Recent Labs Lab 03/07/16 2305  LIPASE 25   No results for input(s): AMMONIA in the last 168 hours. CBC:  Recent Labs Lab 03/07/16 2305  WBC 11.9*  HGB 13.7  HCT 39.4  MCV 91.0  PLT 293   Cardiac Enzymes: No results for input(s): CKTOTAL, CKMB, CKMBINDEX, TROPONINI in the last 168 hours. BNP: Invalid input(s): POCBNP CBG: No results for input(s): GLUCAP in the last 168 hours. D-Dimer No results for input(s): DDIMER in the last 72 hours. Hgb A1c No results for input(s): HGBA1C in the last 72 hours. Lipid Profile No results for input(s): CHOL, HDL, LDLCALC, TRIG, CHOLHDL, LDLDIRECT in the last 72 hours. Thyroid function studies No results for input(s): TSH, T4TOTAL, T3FREE, THYROIDAB in the last 72 hours.  Invalid input(s): FREET3 Anemia work up No results for input(s): VITAMINB12, FOLATE, FERRITIN, TIBC, IRON, RETICCTPCT in the last 72 hours. Urinalysis    Component Value Date/Time   COLORURINE YELLOW 03/08/2016 0010   APPEARANCEUR CLOUDY (A) 03/08/2016 0010   LABSPEC 1.023 03/08/2016 0010   PHURINE 6.0 03/08/2016 0010   GLUCOSEU NEGATIVE 03/08/2016 0010   HGBUR NEGATIVE 03/08/2016 0010   BILIRUBINUR NEGATIVE 03/08/2016 0010   KETONESUR >80 (A) 03/08/2016 0010   PROTEINUR NEGATIVE 03/08/2016 0010   NITRITE NEGATIVE 03/08/2016 0010   LEUKOCYTESUR NEGATIVE 03/08/2016 0010   Sepsis Labs Invalid input(s): PROCALCITONIN,  WBC,  LACTICIDVEN Microbiology No results found for this or any previous visit (from the past 240 hour(s)).   Time coordinating discharge:  Over 45 minutes.   SIGNED:   Coralie KeensMauricio Daniel Arrien, MD  [facility] 03/08/2016, 1:46 PM Pager   If 7PM-7AM, please contact night-coverage [email or web address] Password [email or web address]

## 2016-03-08 NOTE — ED Notes (Signed)
Pt A&Ox4, ambulatory at d/c with steady gait, NAD and pt verbalized understanding of d/c instructions, prescriptions and follow up care.

## 2016-03-08 NOTE — ED Notes (Signed)
ED Provider at bedside discussing plan of care options.

## 2016-03-08 NOTE — ED Notes (Signed)
Pt sipping some water.  No vomiting.

## 2016-03-08 NOTE — Progress Notes (Signed)
This is a no charge note   Pending admission per Dr. Mora Bellmanni.  21 year old lady with past medical history of asthma, who presents with nausea, vomiting and right upper quadrant abdominal pain, fever and chills. No diarrhea. Abdominal ultrasound is negative. WBC 11.9, lipase 25, negative pregnancy test, negative urinalysis, electrolytes and renal function okay, temperature 101.4, tachycardia, tachypnea, hypotension with blood pressure 89/48, which improved to 102/61 after 3 L normal saline bolus. Patient is currently receiving fourth liter normal saline. F/u Ct-abd/pelvis. Pt is accepted to SDU as inpt. IV Zosyn was given in ED.  Lorretta HarpXilin River Ambrosio, MD  [facility] Pager [phone]  If 7PM-7AM, please contact night-coverage [email or web address] Password [email or web address] 03/08/2016, 5:17 AM

## 2016-05-30 DIAGNOSIS — J3089 Other allergic rhinitis: Secondary | ICD-10-CM | POA: Diagnosis not present

## 2016-05-30 DIAGNOSIS — J3081 Allergic rhinitis due to animal (cat) (dog) hair and dander: Secondary | ICD-10-CM | POA: Diagnosis not present

## 2016-05-30 DIAGNOSIS — J4540 Moderate persistent asthma, uncomplicated: Secondary | ICD-10-CM | POA: Diagnosis not present

## 2016-05-31 DIAGNOSIS — J4540 Moderate persistent asthma, uncomplicated: Secondary | ICD-10-CM | POA: Diagnosis not present

## 2016-08-03 ENCOUNTER — Encounter: Payer: Self-pay | Admitting: *Deleted

## 2016-08-03 ENCOUNTER — Ambulatory Visit: Payer: Self-pay

## 2016-08-03 ENCOUNTER — Encounter: Payer: Self-pay | Admitting: Family Medicine

## 2016-08-03 ENCOUNTER — Ambulatory Visit (INDEPENDENT_AMBULATORY_CARE_PROVIDER_SITE_OTHER): Payer: 59 | Admitting: Family Medicine

## 2016-08-03 VITALS — BP 110/74 | HR 71 | Ht 62.0 in

## 2016-08-03 DIAGNOSIS — M25532 Pain in left wrist: Secondary | ICD-10-CM

## 2016-08-03 DIAGNOSIS — M25531 Pain in right wrist: Secondary | ICD-10-CM | POA: Diagnosis not present

## 2016-08-03 DIAGNOSIS — M778 Other enthesopathies, not elsewhere classified: Secondary | ICD-10-CM | POA: Diagnosis not present

## 2016-08-03 MED ORDER — VITAMIN D (ERGOCALCIFEROL) 1.25 MG (50000 UNIT) PO CAPS: 50000.0000 [IU] | ORAL_CAPSULE | ORAL | 0 refills | Status: DC

## 2016-08-03 MED ORDER — DICLOFENAC SODIUM 2 % TD SOLN: 2.0000 "application " | Freq: Two times a day (BID) | TRANSDERMAL | 3 refills | Status: AC

## 2016-08-03 NOTE — Progress Notes (Signed)
Tawana ScaleZach Peni Rupard D.O. [facility] [address] [place], [place] [address] Phone: [phone] Subjective:    I'm seeing this patient by the request  of:  Patient, No Pcp Per   CC: Wrist pain.  BJY:[id number]:Subjective  Bridget Melendez is a 21 y.o. female coming in with complaint of wrist pain. Patient states that this pain is bilateral. 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 Has had this intermittent. Patient states that increasing activity unfortunate seems to make it worse. Patient has tried bracing and over-the-counter medications.     Past Medical History:  Diagnosis Date  . Asthma    No past surgical history on file. Social History   Social History  . Marital status: Single    Spouse name: N/A  . Number of children: N/A  . Years of education: N/A   Social History Main Topics  . Smoking status: Never Smoker  . Smokeless tobacco: Never Used  . Alcohol use No  . Drug use: No  . Sexual activity: Yes    Birth control/ protection: None   Other Topics Concern  . None   Social History Narrative  . None   Allergies  Allergen Reactions  . Sulfa Antibiotics Hives   No family history on file. Positive family history of lupus in aunt and uncle  Past medical history, social, surgical and family history all reviewed in electronic medical record.  No pertanent information unless stated regarding to the chief complaint.   Review of Systems:Review of systems updated and as accurate as of 08/03/16  No headache, visual changes, nausea, vomiting, diarrhea, constipation, dizziness, abdominal pain, skin rash, fevers, chills, night sweats, weight loss, swollen lymph nodes, , chest pain, shortness of breath, mood changes. Positive muscle aches, body aches  Objective  Blood pressure 110/74, pulse 71, height 5\' 2"  (1.575 m), SpO2 95 %. Systems examined below as of 08/03/16   General: No apparent distress alert and oriented  x3 mood and affect normal, dressed appropriately.  HEENT: Pupils equal, extraocular movements intact  Respiratory: Patient's speak in full sentences and does not appear short of breath  Cardiovascular: No lower extremity edema, non tender, no erythema  Skin: Warm dry intact with no signs of infection or rash on extremities or on axial skeleton.  Abdomen: Soft nontender  Neuro: Cranial nerves II through XII are intact, neurovascularly intact in all extremities with 2+ DTRs and 2+ pulses.  Lymph: No lymphadenopathy of posterior or anterior cervical chain or axillae bilaterally.  Gait normal with good balance and coordination.  MSK:  Non tender with full range of motion and good stability and symmetric strength and tone of shoulders, elbows, hip, knee and ankles bilaterally.  Wrist: Bilateral Inspection normal with no visible erythema or swelling. ROM smooth and normal with good flexion and extension and ulnar/radial deviation that is symmetrical with opposite wrist. Mild diffuse tenderness more over the dorsal aspect of the wrist No snuffbox tenderness. No tenderness over Canal of [place]. Strength 5/5 in all directions without pain. Negative Finkelstein, tinel's and phalens. Negative Watson's test.   MSK US performed of: Bilateral wrist This study was ordered, performed, and interpreted by Terrilee FilesZach Qamar Aughenbaugh D.O.  Wrist: Nonspecific swelling noted of the extensor tendons. Otherwise fairly unremarkable. IMPRESSION:  Nonspecific tenosynovitis of the wrists     Impression and Recommendations:     This case required medical decision making of moderate complexity.      Note:  This dictation was prepared with Dragon dictation along with smaller phrase technology. Any transcriptional errors that result from this process are unintentional.

## 2016-08-03 NOTE — Patient Instructions (Addendum)
Good to see you.  Ice 20 minutes 2 times daily. Usually after activity and before bed. Exercises 3 times a week.  pennsaid pinkie amount topically 2 times daily as needed.  Once weekly vitamin D for 12 weeks Then over the counter turmeric 500mg  twice daily  Maybe brace at night See me again in 3 weeks.

## 2016-08-03 NOTE — Assessment & Plan Note (Signed)
I believe the patient does have more of a tendinitis secondary to overuse. Discussed with patient to avoid any significant repetitive activity at this point. Bracing given, topical anti-inflammatories and home exercises. We discussed once weekly vitamin D for strength and conditioning. Patient does have a strong family history of lupus. May need laboratory workup if continuing. Follow-up again in 4 weeks

## 2016-08-23 ENCOUNTER — Ambulatory Visit: Payer: 59 | Admitting: Family Medicine

## 2016-08-23 DIAGNOSIS — J020 Streptococcal pharyngitis: Secondary | ICD-10-CM | POA: Diagnosis not present

## 2016-09-01 DIAGNOSIS — J4540 Moderate persistent asthma, uncomplicated: Secondary | ICD-10-CM | POA: Diagnosis not present

## 2016-09-01 DIAGNOSIS — Z0000 Encounter for general adult medical examination without abnormal findings: Secondary | ICD-10-CM | POA: Diagnosis not present

## 2016-09-01 DIAGNOSIS — Z23 Encounter for immunization: Secondary | ICD-10-CM | POA: Diagnosis not present

## 2016-09-01 DIAGNOSIS — J3089 Other allergic rhinitis: Secondary | ICD-10-CM | POA: Diagnosis not present

## 2016-09-05 ENCOUNTER — Ambulatory Visit: Payer: 59 | Admitting: Family Medicine

## 2016-10-25 ENCOUNTER — Other Ambulatory Visit: Payer: Self-pay | Admitting: Family Medicine

## 2016-11-07 DIAGNOSIS — M79642 Pain in left hand: Secondary | ICD-10-CM | POA: Diagnosis not present

## 2016-11-07 DIAGNOSIS — M2550 Pain in unspecified joint: Secondary | ICD-10-CM | POA: Diagnosis not present

## 2016-11-07 DIAGNOSIS — M79641 Pain in right hand: Secondary | ICD-10-CM | POA: Diagnosis not present

## 2016-11-29 DIAGNOSIS — J3089 Other allergic rhinitis: Secondary | ICD-10-CM | POA: Diagnosis not present

## 2016-11-29 DIAGNOSIS — J4540 Moderate persistent asthma, uncomplicated: Secondary | ICD-10-CM | POA: Diagnosis not present

## 2016-11-29 DIAGNOSIS — J3081 Allergic rhinitis due to animal (cat) (dog) hair and dander: Secondary | ICD-10-CM | POA: Diagnosis not present

## 2017-01-16 DIAGNOSIS — H01009 Unspecified blepharitis unspecified eye, unspecified eyelid: Secondary | ICD-10-CM | POA: Diagnosis not present

## 2017-01-16 DIAGNOSIS — H00013 Hordeolum externum right eye, unspecified eyelid: Secondary | ICD-10-CM | POA: Diagnosis not present

## 2017-01-28 ENCOUNTER — Other Ambulatory Visit: Payer: Self-pay | Admitting: Family Medicine

## 2017-03-03 DIAGNOSIS — J029 Acute pharyngitis, unspecified: Secondary | ICD-10-CM | POA: Diagnosis not present

## 2017-05-03 ENCOUNTER — Other Ambulatory Visit: Payer: Self-pay | Admitting: Family Medicine

## 2017-05-23 DIAGNOSIS — J069 Acute upper respiratory infection, unspecified: Secondary | ICD-10-CM | POA: Diagnosis not present

## 2017-05-23 DIAGNOSIS — J020 Streptococcal pharyngitis: Secondary | ICD-10-CM | POA: Diagnosis not present

## 2017-08-07 ENCOUNTER — Other Ambulatory Visit: Payer: Self-pay | Admitting: Family Medicine

## 2017-08-07 NOTE — Telephone Encounter (Signed)
Refill denied. Pt has completed course of treatment.  

## 2017-10-11 IMAGING — US US ABDOMEN LIMITED
1 series · 14 of 25 positions shown · non-contrast
Comparison: None.

CLINICAL DATA: 20-year-old female with right upper quadrant
abdominal pain and fever.

EXAM:
US ABDOMEN LIMITED - RIGHT UPPER QUADRANT

[Series 1: us abdomen limited · 0.22mm/px · 14 of 46 slices shown]
[im 1/46]
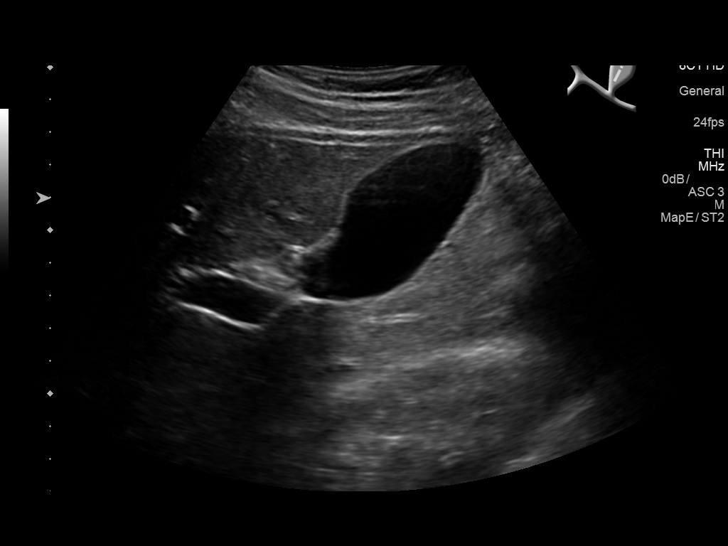
[im 4/46]
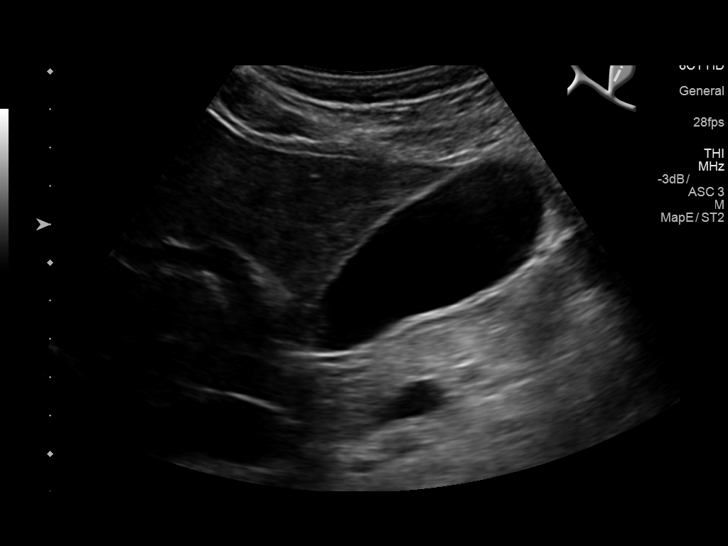
[im 8/46]
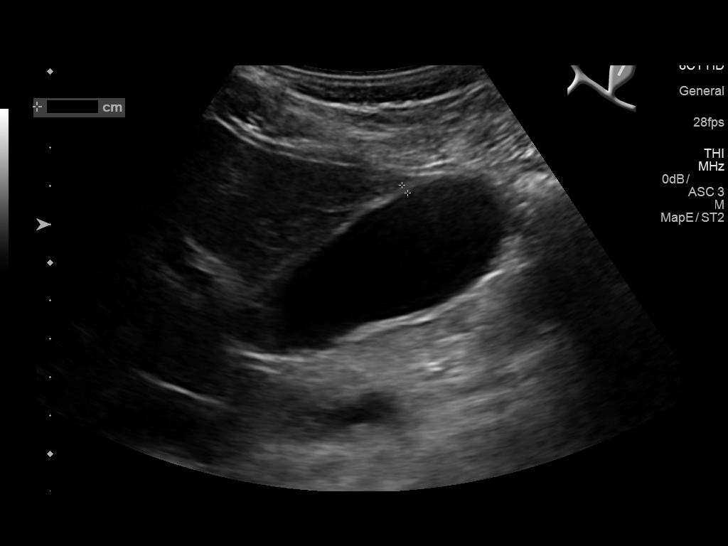
[im 12/46]
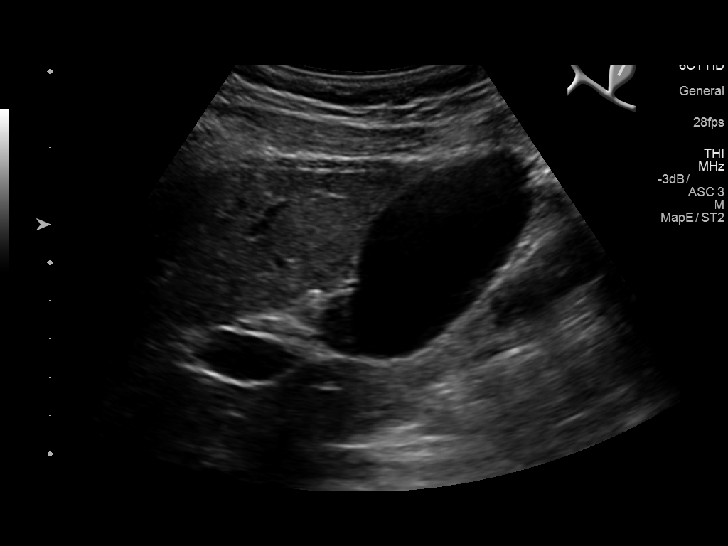
[im 16/46]
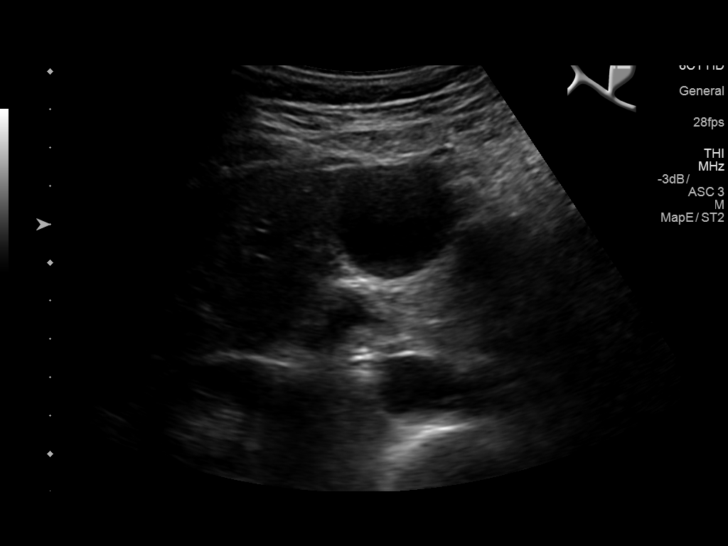
[im 17/46]
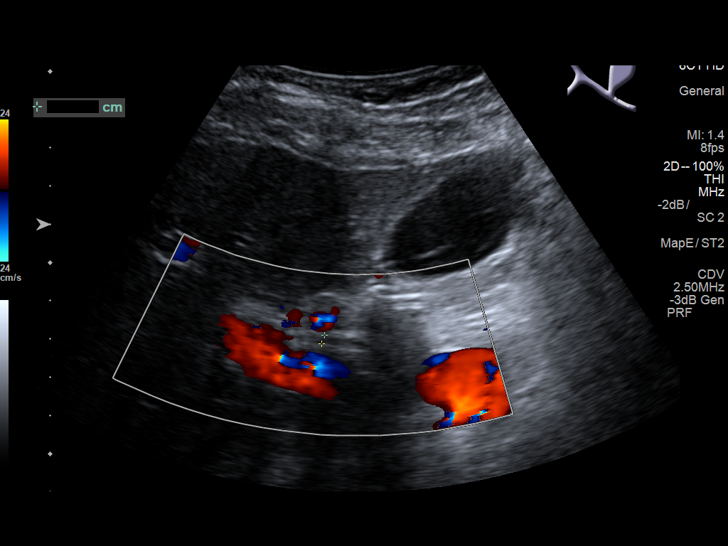
[im 21/46]
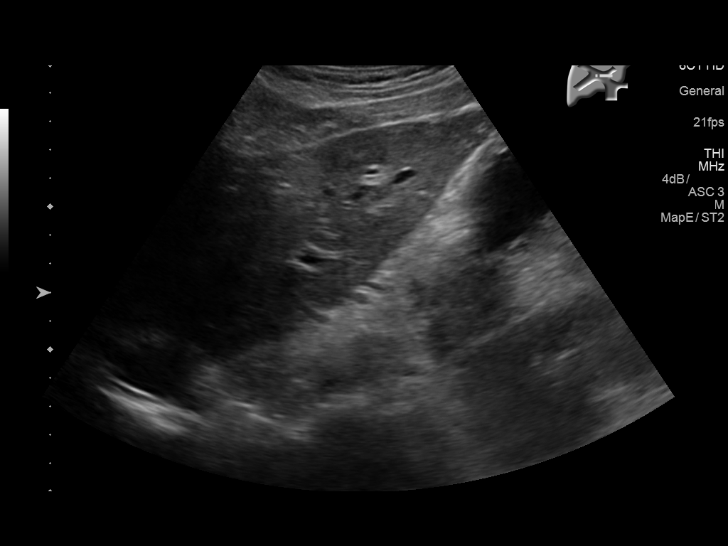
[im 25/46]
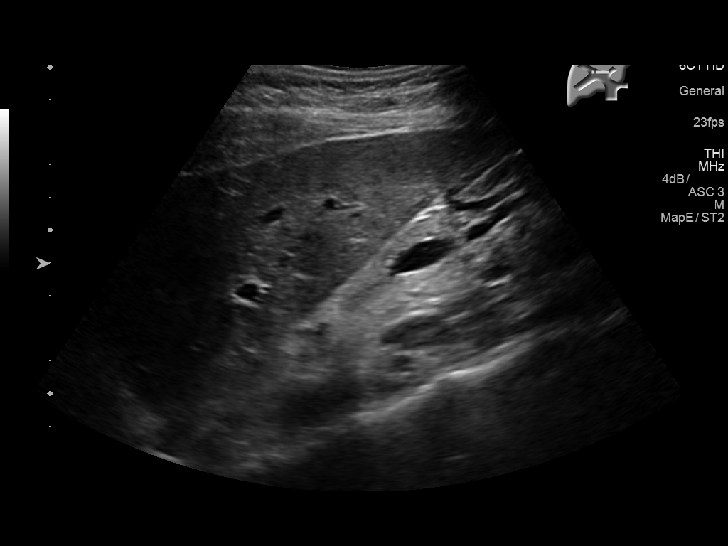
[im 29/46]
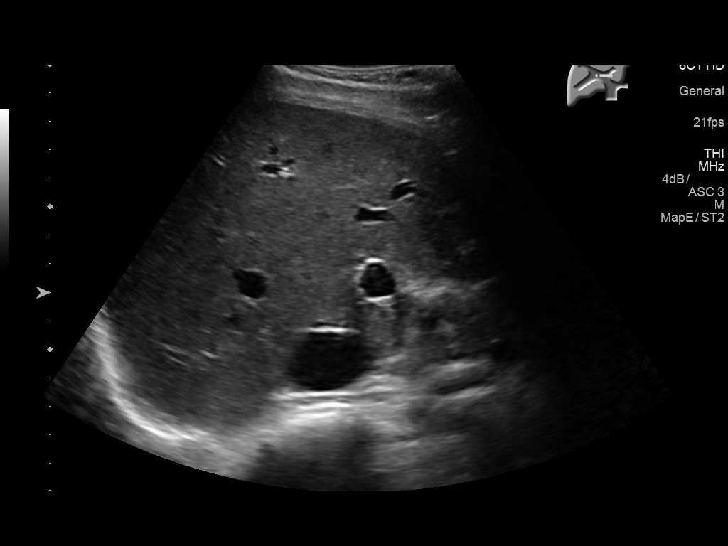
[im 31/46]
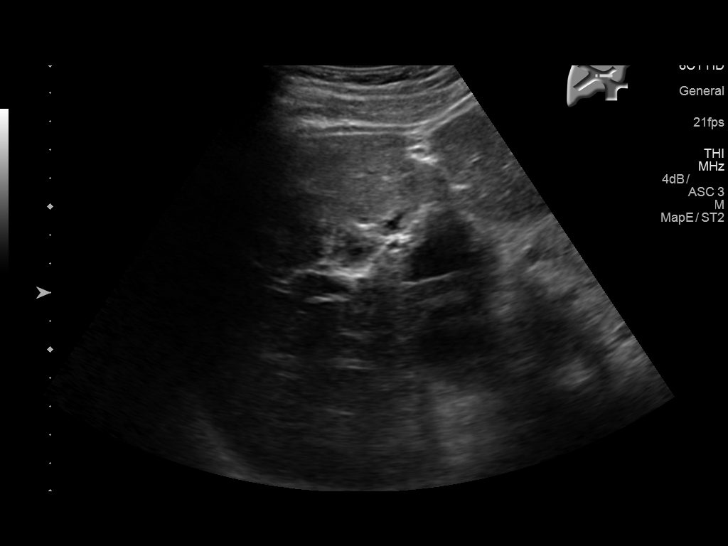
[im 34/46]
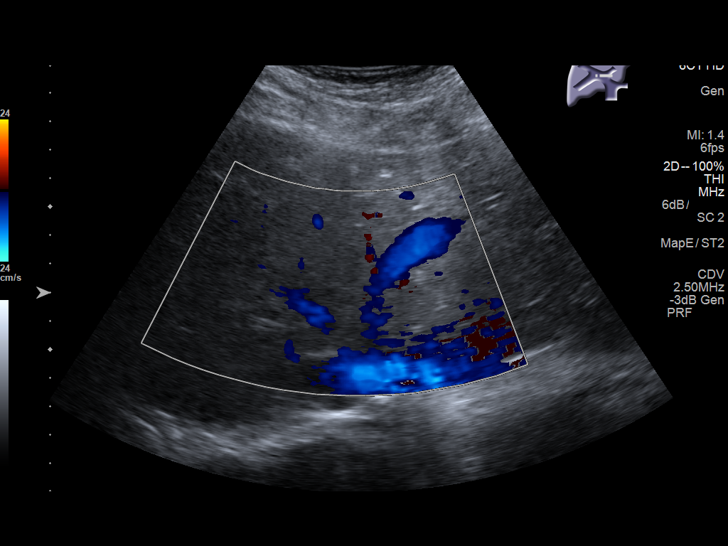
[im 38/46]
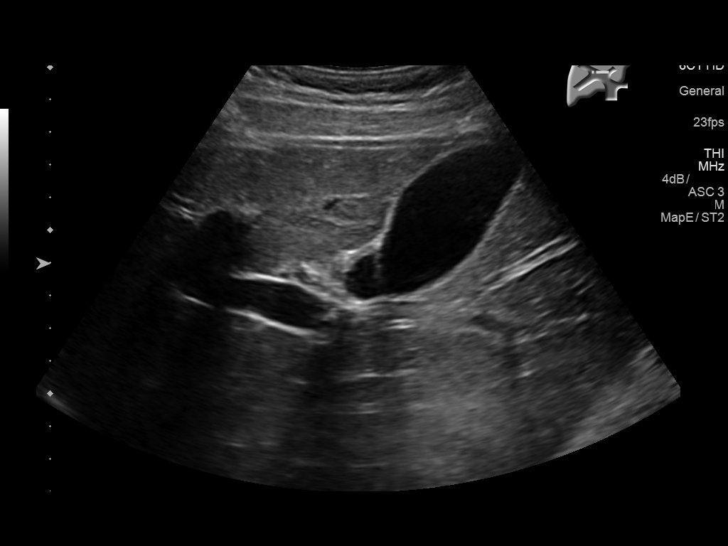
[im 42/46]
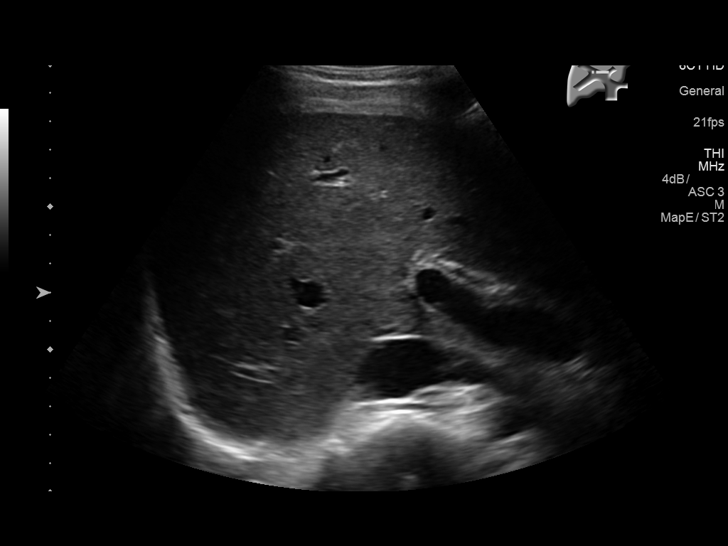
[im 46/46]
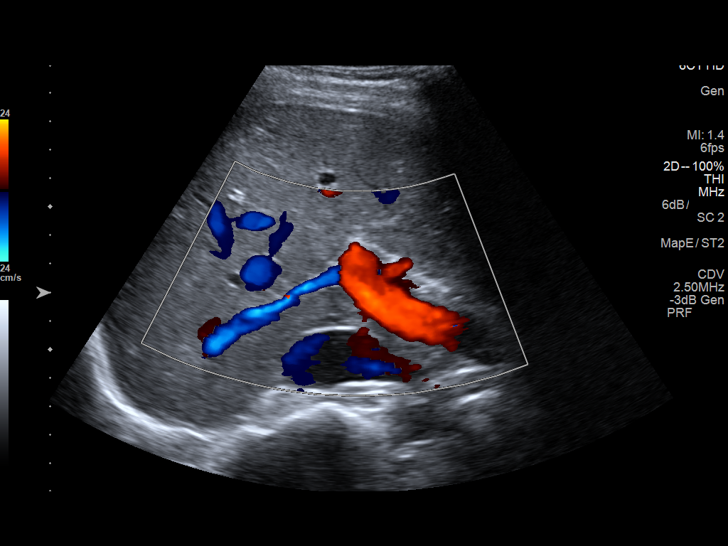

[14 of 25 positions shown; findings below may reference images not displayed]

FINDINGS: Gallbladder:

No gallstones or wall thickening visualized. No sonographic Murphy
sign noted by sonographer.

Common bile duct:

Diameter: 2 mm

Liver:

No focal lesion identified. Within normal limits in parenchymal
echogenicity.
IMPRESSION: Unremarkable right upper quadrant ultrasound.

## 2017-12-27 DIAGNOSIS — J3081 Allergic rhinitis due to animal (cat) (dog) hair and dander: Secondary | ICD-10-CM | POA: Diagnosis not present

## 2017-12-27 DIAGNOSIS — J4540 Moderate persistent asthma, uncomplicated: Secondary | ICD-10-CM | POA: Diagnosis not present

## 2017-12-27 DIAGNOSIS — J3089 Other allergic rhinitis: Secondary | ICD-10-CM | POA: Diagnosis not present

## 2018-05-03 DIAGNOSIS — J029 Acute pharyngitis, unspecified: Secondary | ICD-10-CM | POA: Diagnosis not present
